# Patient Record
Sex: Male | Born: 1995 | Race: White | Hispanic: No | Marital: Single | Smoking: Never smoker
Health system: Southern US, Community
[De-identification: ages and names within clinical notes are randomized; demographics above are authoritative.]

## PROBLEM LIST (undated history)

## (undated) DIAGNOSIS — J45909 Unspecified asthma, uncomplicated: Secondary | ICD-10-CM

---

## 2014-12-08 ENCOUNTER — Encounter (HOSPITAL_BASED_OUTPATIENT_CLINIC_OR_DEPARTMENT_OTHER): Payer: Self-pay | Admitting: *Deleted

## 2014-12-08 ENCOUNTER — Emergency Department (HOSPITAL_BASED_OUTPATIENT_CLINIC_OR_DEPARTMENT_OTHER): Payer: BLUE CROSS/BLUE SHIELD

## 2014-12-08 ENCOUNTER — Emergency Department (HOSPITAL_BASED_OUTPATIENT_CLINIC_OR_DEPARTMENT_OTHER)
Admission: EM | Admit: 2014-12-08 | Discharge: 2014-12-08 | Disposition: A | Discharge status: Home / Self Care | Payer: BLUE CROSS/BLUE SHIELD | Attending: Emergency Medicine | Admitting: Emergency Medicine

## 2014-12-08 DIAGNOSIS — S99921A Unspecified injury of right foot, initial encounter: Secondary | ICD-10-CM | POA: Diagnosis present

## 2014-12-08 DIAGNOSIS — Y9389 Activity, other specified: Secondary | ICD-10-CM | POA: Insufficient documentation

## 2014-12-08 DIAGNOSIS — Y9289 Other specified places as the place of occurrence of the external cause: Secondary | ICD-10-CM | POA: Insufficient documentation

## 2014-12-08 DIAGNOSIS — S92421A Displaced fracture of distal phalanx of right great toe, initial encounter for closed fracture: Secondary | ICD-10-CM | POA: Insufficient documentation

## 2014-12-08 DIAGNOSIS — S6981XA Other specified injuries of right wrist, hand and finger(s), initial encounter: Secondary | ICD-10-CM

## 2014-12-08 DIAGNOSIS — W208XXA Other cause of strike by thrown, projected or falling object, initial encounter: Secondary | ICD-10-CM | POA: Insufficient documentation

## 2014-12-08 DIAGNOSIS — S92911B Unspecified fracture of right toe(s), initial encounter for open fracture: Secondary | ICD-10-CM

## 2014-12-08 DIAGNOSIS — Y990 Civilian activity done for income or pay: Secondary | ICD-10-CM | POA: Insufficient documentation

## 2014-12-08 MED ORDER — CEPHALEXIN 500 MG PO CAPS: 500.0000 mg | ORAL_CAPSULE | Freq: Four times a day (QID) | ORAL | Status: DC

## 2014-12-08 MED ORDER — LIDOCAINE HCL 2 % IJ SOLN: 10.0000 mL | Freq: Once | INTRAMUSCULAR | Status: DC

## 2014-12-08 MED ORDER — LIDOCAINE HCL 2 % IJ SOLN
INTRAMUSCULAR | Status: AC
  Filled 2014-12-08: qty 20

## 2014-12-08 NOTE — ED Notes (Signed)
Pt to triage in w/c, reports dropping a weight on his right great toe while working out 2 days ago.

## 2014-12-08 NOTE — Discharge Instructions (Signed)
Cast or Splint Care Casts and splints support injured limbs and keep bones from moving while they heal.  HOME CARE  Keep the cast or splint uncovered during the drying period.  A plaster cast can take 24 to 48 hours to dry.  A fiberglass cast will dry in less than 1 hour.  Do not rest the cast on anything harder than a pillow for 24 hours.  Do not put weight on your injured limb. Do not put pressure on the cast. Wait for your doctor's approval.  Keep the cast or splint dry.  Cover the cast or splint with a plastic bag during baths or wet weather.  If you have a cast over your chest and belly (trunk), take sponge baths until the cast is taken off.  If your cast gets wet, dry it with a towel or blow dryer. Use the cool setting on the blow dryer.  Keep your cast or splint clean. Wash a dirty cast with a damp cloth.  Do not put any objects under your cast or splint.  Do not scratch the skin under the cast with an object. If itching is a problem, use a blow dryer on a cool setting over the itchy area.  Do not trim or cut your cast.  Do not take out the padding from inside your cast.  Exercise your joints near the cast as told by your doctor.  Raise (elevate) your injured limb on 1 or 2 pillows for the first 1 to 3 days. GET HELP IF:  Your cast or splint cracks.  Your cast or splint is too tight or too loose.  You itch badly under the cast.  Your cast gets wet or has a soft spot.  You have a bad smell coming from the cast.  You get an object stuck under the cast.  Your skin around the cast becomes red or sore.  You have new or more pain after the cast is put on. GET HELP RIGHT AWAY IF:  You have fluid leaking through the cast.  You cannot move your fingers or toes.  Your fingers or toes turn blue or white or are cool, painful, or puffy (swollen).  You have tingling or lose feeling (numbness) around the injured area.  You have bad pain or pressure under the  cast.  You have trouble breathing or have shortness of breath.  You have chest pain. Document Released: 10/24/2010 Document Revised: 02/24/2013 Document Reviewed: 12/31/2012 [facility] Patient Information 2015 [place], [place]. This information is not intended to replace advice given to you by your health care provider. Make sure you discuss any questions you have with your health care provider.  Nail Avulsion Injury Nail avulsion means that you have lost the whole, or part of a nail. The nail will usually grow back in 2 to 6 months. If your injury damaged the growth center of the nail, the nail may be deformed, split, or not stuck to the nail bed. Sometimes the avulsed nail is stitched back in place. This provides temporary protection to the nail bed until the new nail grows in.  HOME CARE INSTRUCTIONS   Raise (elevate) your injury as much as possible.  Protect the injury and cover it with bandages (dressings) or splints as instructed.  Change dressings as instructed. SEEK MEDICAL CARE IF:   There is increasing pain, redness, or swelling.  You cannot move your fingers or toes. Document Released: 08/01/2004 Document Revised: 09/16/2011 Document Reviewed: 05/26/2009 ExitCare Patient Information 2015  [employer]. This information is not intended to replace advice given to you by your health care provider. Make sure you discuss any questions you have with your health care provider.

## 2014-12-08 NOTE — ED Provider Notes (Signed)
CSN: [id number]     Arrival date & time 12/08/14  1619 History   First MD Initiated Contact with Patient 12/08/14 1635     Chief Complaint  Patient presents with  . Foot Pain     (Consider location/radiation/quality/duration/timing/severity/associated sxs/prior Treatment) HPI Comments: He dropped a wt to his toe and he was trying to take care of it himself and it doesn't seem to be getting any better  Patient is a 19 y.o. male presenting with lower extremity pain. The history is provided by the patient. No language interpreter was used.  Foot Pain This is a new problem. The current episode started in the past 7 days. The problem occurs constantly. The problem has been unchanged. Pertinent negatives include no numbness. The symptoms are aggravated by walking.    History reviewed. No pertinent past medical history. History reviewed. No pertinent past surgical history. No family history on file. History  Substance Use Topics  . Smoking status: Never Smoker   . Smokeless tobacco: Not on file  . Alcohol Use: Not on file    Review of Systems  Neurological: Negative for numbness.  All other systems reviewed and are negative.     Allergies  Review of patient's allergies indicates no known allergies.  Home Medications   Prior to Admission medications   Not on File   There were no vitals taken for this visit. Physical Exam  Constitutional: He is oriented to person, place, and time. He appears well-developed and well-nourished.  Cardiovascular: Normal rate and regular rhythm.   Pulmonary/Chest: Effort normal and breath sounds normal.  Musculoskeletal:  Obvious swelling and tenderness noted to the right great toe  Neurological: He is alert and oriented to person, place, and time.  Skin: Skin is warm and dry.  Partial nail avulsion on the right great toe  Nursing note and vitals reviewed.   ED Course  NAIL REMOVAL Date/Time: 12/08/2014 6:05 PM Performed by: Teressa LowerPICKERING,  Brogen Duell Authorized by: Teressa LowerPICKERING, Keasia Dubose Consent: Verbal consent obtained. Consent given by: patient Patient identity confirmed: verbally with patient Anesthesia: digital block Local anesthetic: lidocaine 2% without epinephrine Preparation: skin prepped with Betadine Amount removed: complete Wedge excision of skin of nail fold: yes Nail bed sutured: no Removed nail replaced and anchored: yes Dressing: dressing applied and petrolatum-impregnated gauze Patient tolerance: Patient tolerated the procedure well with no immediate complications   (including critical care time) Labs Review Labs Reviewed - No data to display  Imaging Review Dg Toe Great Right  12/08/2014   CLINICAL DATA:  Patient dropped weight on right great toe 2 days ago  EXAM: RIGHT GREAT TOE  COMPARISON:  None.  FINDINGS: There is a fracture deformity involving the stress that nondisplaced fracture involves the tuft of the distal phalanx. There is overlying diffuse soft tissue swelling. No radiopaque foreign bodies identified.  IMPRESSION: 1. Acute fracture involves the tuft of the distal phalanx.   Electronically Signed   By: Signa Kellaylor  Stroud M.D.   On: 12/08/2014 17:32     EKG Interpretation None      MDM   Final diagnoses:  Open toe fracture, right, initial encounter  Nailbed injury, right, initial encounter    Pt started on keflex as open toe fracture. Toe nail removed    Teressa LowerVrinda Ayde Record, NP 12/08/14 1806  Vanetta MuldersScott Zackowski, MD 12/08/14 2317

## 2015-07-15 ENCOUNTER — Emergency Department (HOSPITAL_COMMUNITY)
Admission: EM | Admit: 2015-07-15 | Discharge: 2015-07-15 | Disposition: A | Discharge status: Home / Self Care | Payer: 59 | Attending: Emergency Medicine | Admitting: Emergency Medicine

## 2015-07-15 ENCOUNTER — Encounter (HOSPITAL_COMMUNITY): Payer: Self-pay | Admitting: *Deleted

## 2015-07-15 ENCOUNTER — Emergency Department (HOSPITAL_COMMUNITY): Payer: 59

## 2015-07-15 DIAGNOSIS — Y9389 Activity, other specified: Secondary | ICD-10-CM | POA: Diagnosis not present

## 2015-07-15 DIAGNOSIS — S4992XA Unspecified injury of left shoulder and upper arm, initial encounter: Secondary | ICD-10-CM | POA: Insufficient documentation

## 2015-07-15 DIAGNOSIS — Y998 Other external cause status: Secondary | ICD-10-CM | POA: Diagnosis not present

## 2015-07-15 DIAGNOSIS — M25512 Pain in left shoulder: Secondary | ICD-10-CM

## 2015-07-15 DIAGNOSIS — Z792 Long term (current) use of antibiotics: Secondary | ICD-10-CM | POA: Insufficient documentation

## 2015-07-15 DIAGNOSIS — Y9289 Other specified places as the place of occurrence of the external cause: Secondary | ICD-10-CM | POA: Diagnosis not present

## 2015-07-15 MED ORDER — IBUPROFEN 800 MG PO TABS
800.0000 mg | ORAL_TABLET | Freq: Once | ORAL | Status: AC
  Administered 2015-07-15: 800 mg via ORAL
  Filled 2015-07-15: qty 1

## 2015-07-15 MED ORDER — OXYCODONE HCL 5 MG PO TABS
5.0000 mg | ORAL_TABLET | Freq: Once | ORAL | Status: AC
  Administered 2015-07-15: 5 mg via ORAL
  Filled 2015-07-15: qty 1

## 2015-07-15 MED ORDER — ACETAMINOPHEN 500 MG PO TABS
1000.0000 mg | ORAL_TABLET | Freq: Once | ORAL | Status: AC
  Administered 2015-07-15: 1000 mg via ORAL
  Filled 2015-07-15: qty 2

## 2015-07-15 NOTE — ED Notes (Signed)
The pt fell snow boarding just pta.  He is c/o lt shoulder pain.  No  Previous injury

## 2015-07-15 NOTE — ED Notes (Signed)
The pts pain is less now than earlier.  More pain with movement.  Goof radial pulse on the lt

## 2015-07-15 NOTE — Discharge Instructions (Signed)
Take 4 over the counter ibuprofen tablets 3 times a day or 2 over-the-counter naproxen tablets twice a day for pain. ° °Joint Pain °Joint pain, which is also called arthralgia, can be caused by many things. Joint pain often goes away when you follow your health care provider's instructions for relieving pain at home. However, joint pain can also be caused by conditions that require further treatment. Common causes of joint pain include: °· Bruising in the area of the joint. °· Overuse of the joint. °· Wear and tear on the joints that occur with aging (osteoarthritis). °· Various other forms of arthritis. °· A buildup of a crystal form of uric acid in the joint (gout). °· Infections of the joint (septic arthritis) or of the bone (osteomyelitis). °Your health care provider may recommend medicine to help with the pain. If your joint pain continues, additional tests may be needed to diagnose your condition. °HOME CARE INSTRUCTIONS °Watch your condition for any changes. Follow these instructions as directed to lessen the pain that you are feeling. °· Take medicines only as directed by your health care provider. °· Rest the affected area for as long as your health care provider says that you should. If directed to do so, raise the painful joint above the level of your heart while you are sitting or lying down. °· Do not do things that cause or worsen pain. °· If directed, apply ice to the painful area: °¨ Put ice in a plastic bag. °¨ Place a towel between your skin and the bag. °¨ Leave the ice on for 20 minutes, 2-3 times per day. °· Wear an elastic bandage, splint, or sling as directed by your health care provider. Loosen the elastic bandage or splint if your fingers or toes become numb and tingle, or if they turn cold and blue. °· Begin exercising or stretching the affected area as directed by your health care provider. Ask your health care provider what types of exercise are safe for you. °· Keep all follow-up visits  as directed by your health care provider. This is important. °SEEK MEDICAL CARE IF: °· Your pain increases, and medicine does not help. °· Your joint pain does not improve within 3 days. °· You have increased bruising or swelling. °· You have a fever. °· You lose 10 lb (4.5 kg) or more without trying. °SEEK IMMEDIATE MEDICAL CARE IF: °· You are not able to move the joint. °· Your fingers or toes become numb or they turn cold and blue. °  °This information is not intended to replace advice given to you by your health care provider. Make sure you discuss any questions you have with your health care provider. °  °Document Released: 06/24/2005 Document Revised: 07/15/2014 Document Reviewed: 04/05/2014 °Elsevier Interactive Patient Education ©2016 [employer] ° °

## 2015-07-15 NOTE — ED Provider Notes (Signed)
CSN: [id number]     Arrival date & time 07/15/15  1629 History   First MD Initiated Contact with Patient 07/15/15 1634     Chief Complaint  Patient presents with  . Shoulder Injury     (Consider location/radiation/quality/duration/timing/severity/associated sxs/prior Treatment) Patient is a 20 y.o. male presenting with shoulder injury. The history is provided by the patient.  Shoulder Injury This is a new problem. The current episode started 1 to 2 hours ago. The problem occurs constantly. The problem has not changed since onset.Pertinent negatives include no chest pain, no abdominal pain, no headaches and no shortness of breath. Nothing aggravates the symptoms. Nothing relieves the symptoms. He has tried nothing for the symptoms. The treatment provided no relief.    20 yo M with a chief complaint of a left shoulder injury. Patient was snowboarding and fell onto his anterior aspect of his left shoulder. Had significant pain and tenderness to the area then moved a certain way and felt a pop. Since then patient has been feeling much better. Denies head injury loss of consciousness neck pain back pain chest pain abdominal pain.  History reviewed. No pertinent past medical history. History reviewed. No pertinent past surgical history. No family history on file. Social History  Substance Use Topics  . Smoking status: Never Smoker   . Smokeless tobacco: None  . Alcohol Use: Yes    Review of Systems  Constitutional: Negative for fever and chills.  HENT: Negative for congestion and facial swelling.   Eyes: Negative for discharge and visual disturbance.  Respiratory: Negative for shortness of breath.   Cardiovascular: Negative for chest pain and palpitations.  Gastrointestinal: Negative for vomiting, abdominal pain and diarrhea.  Musculoskeletal: Positive for myalgias and arthralgias.  Skin: Negative for color change and rash.  Neurological: Negative for tremors, syncope and headaches.   Psychiatric/Behavioral: Negative for confusion and dysphoric mood.      Allergies  Shellfish allergy  Home Medications   Prior to Admission medications   Medication Sig Start Date End Date Taking? Authorizing Provider  cephALEXin (KEFLEX) 500 MG capsule Take 1 capsule (500 mg total) by mouth 4 (four) times daily. 12/08/14   Teressa LowerVrinda Pickering, NP   BP 143/77 mmHg  Pulse 105  Temp(Src) 98.2 F (36.8 C) (Oral)  Resp 16  SpO2 96% Physical Exam  Constitutional: He is oriented to person, place, and time. He appears well-developed and well-nourished.  HENT:  Head: Normocephalic and atraumatic.  Eyes: EOM are normal. Pupils are equal, round, and reactive to light.  Neck: Normal range of motion. Neck supple. No JVD present.  Cardiovascular: Normal rate and regular rhythm.  Exam reveals no gallop and no friction rub.   No murmur heard. Pulmonary/Chest: No respiratory distress. He has no wheezes.  Abdominal: He exhibits no distension. There is no tenderness. There is no rebound and no guarding.  Musculoskeletal: Normal range of motion. He exhibits tenderness. He exhibits no edema.  PMS intact distally, sits muscles intact. No noted signs of trauma to the head no midline C-spine tenderness.  Neurological: He is alert and oriented to person, place, and time.  Skin: No rash noted. No pallor.  Psychiatric: He has a normal mood and affect. His behavior is normal.  Nursing note and vitals reviewed.   ED Course  Procedures (including critical care time) Labs Review Labs Reviewed - No data to display  Imaging Review Dg Shoulder Left  07/15/2015  CLINICAL DATA:  Fall.  Snow boarding accident. EXAM: LEFT SHOULDER -  2+ VIEW COMPARISON:  None. FINDINGS: There is no evidence of fracture or dislocation. There is no evidence of arthropathy or other focal bone abnormality. Soft tissues are unremarkable. IMPRESSION: Negative. Electronically Signed   By: Signa Kell M.D.   On: 07/15/2015 17:14   I  have personally reviewed and evaluated these images and lab results as part of my medical decision-making.   EKG Interpretation None      MDM   Final diagnoses:  Shoulder pain, acute, left    20 yo M with a chief complaint of a left shoulder injury. Grossly normal exam. X-ray appears normal as read by me. Awaiting radiology read.  X-rays negative. Patient is discharged home. NSAIDs for pain. Sling for comfort.  6:14 PM:  I have discussed the diagnosis/risks/treatment options with the patient and family and believe the pt to be eligible for discharge home to follow-up with PCP. We also discussed returning to the ED immediately if new or worsening sx occur. We discussed the sx which are most concerning (e.g., sudden worsening pain, numbness) that necessitate immediate return. Medications administered to the patient during their visit and any new prescriptions provided to the patient are listed below.  Medications given during this visit Medications  acetaminophen (TYLENOL) tablet 1,000 mg (1,000 mg Oral Given 07/15/15 1715)  ibuprofen (ADVIL,MOTRIN) tablet 800 mg (800 mg Oral Given 07/15/15 1715)  oxyCODONE (Oxy IR/ROXICODONE) immediate release tablet 5 mg (5 mg Oral Given 07/15/15 1715)    New Prescriptions   No medications on file    The patient appears reasonably screen and/or stabilized for discharge and I doubt any other medical condition or other [facility] requiring further screening, evaluation, or treatment in the ED at this time prior to discharge.    Melene Plan, DO 07/15/15 1814

## 2015-07-15 NOTE — ED Notes (Signed)
Pt home stable, ambulatory with brother. After verbalizes understanding of instructions.

## 2016-03-04 ENCOUNTER — Encounter (HOSPITAL_COMMUNITY): Payer: Self-pay

## 2016-03-04 ENCOUNTER — Emergency Department (HOSPITAL_COMMUNITY)
Admission: EM | Admit: 2016-03-04 | Discharge: 2016-03-04 | Disposition: A | Discharge status: Home / Self Care | Payer: 59 | Attending: Emergency Medicine | Admitting: Emergency Medicine

## 2016-03-04 ENCOUNTER — Emergency Department (HOSPITAL_COMMUNITY): Payer: Self-pay

## 2016-03-04 DIAGNOSIS — D72829 Elevated white blood cell count, unspecified: Secondary | ICD-10-CM

## 2016-03-04 DIAGNOSIS — R079 Chest pain, unspecified: Secondary | ICD-10-CM | POA: Diagnosis not present

## 2016-03-04 DIAGNOSIS — Z79899 Other long term (current) drug therapy: Secondary | ICD-10-CM | POA: Insufficient documentation

## 2016-03-04 DIAGNOSIS — E876 Hypokalemia: Secondary | ICD-10-CM

## 2016-03-04 DIAGNOSIS — R0789 Other chest pain: Secondary | ICD-10-CM | POA: Insufficient documentation

## 2016-03-04 HISTORY — DX: Unspecified asthma, uncomplicated: J45.909

## 2016-03-04 LAB — I-STAT TROPONIN, ED: Troponin i, poc: 0 ng/mL (ref 0.00–0.08)

## 2016-03-04 LAB — CBC
HEMATOCRIT: 42.9 % (ref 39.0–52.0)
HEMOGLOBIN: 13.9 g/dL (ref 13.0–17.0)
MCH: 20.1 pg — ABNORMAL LOW (ref 26.0–34.0)
MCHC: 32.4 g/dL (ref 30.0–36.0)
MCV: 62.2 fL — AB (ref 78.0–100.0)
Platelets: 279 10*3/uL (ref 150–400)
RBC: 6.9 MIL/uL — ABNORMAL HIGH (ref 4.22–5.81)
RDW: 15.1 % (ref 11.5–15.5)
WBC: 13.2 10*3/uL — AB (ref 4.0–10.5)

## 2016-03-04 LAB — RAPID URINE DRUG SCREEN, HOSP PERFORMED
Amphetamines: NOT DETECTED
BARBITURATES: NOT DETECTED
Benzodiazepines: NOT DETECTED
Cocaine: NOT DETECTED
OPIATES: NOT DETECTED
TETRAHYDROCANNABINOL: POSITIVE — AB

## 2016-03-04 LAB — BASIC METABOLIC PANEL
ANION GAP: 8 (ref 5–15)
BUN: 7 mg/dL (ref 6–20)
CHLORIDE: 108 mmol/L (ref 101–111)
CO2: 23 mmol/L (ref 22–32)
Calcium: 10 mg/dL (ref 8.9–10.3)
Creatinine, Ser: 0.94 mg/dL (ref 0.61–1.24)
GFR calc Af Amer: >60 mL/min (ref >60)
GLUCOSE: 105 mg/dL — AB (ref 65–99)
POTASSIUM: 3.2 mmol/L — AB (ref 3.5–5.1)
SODIUM: 139 mmol/L (ref 135–145)

## 2016-03-04 LAB — D-DIMER, QUANTITATIVE (NOT AT ARMC): D-Dimer, Quant: <0.27 ug/mL-FEU (ref 0.00–0.50)

## 2016-03-04 MED ORDER — ASPIRIN 81 MG PO CHEW
324.0000 mg | CHEWABLE_TABLET | Freq: Once | ORAL | Status: AC
  Administered 2016-03-04: 324 mg via ORAL
  Filled 2016-03-04: qty 4

## 2016-03-04 MED ORDER — POTASSIUM CHLORIDE CRYS ER 20 MEQ PO TBCR
40.0000 meq | EXTENDED_RELEASE_TABLET | Freq: Once | ORAL | Status: AC
Start: 2016-03-04 — End: 2016-03-04
  Administered 2016-03-04: 40 meq via ORAL
  Filled 2016-03-04: qty 2

## 2016-03-04 MED ORDER — IOPAMIDOL (ISOVUE-370) INJECTION 76%
INTRAVENOUS | Status: AC
  Filled 2016-03-04: qty 100

## 2016-03-04 MED ORDER — IOPAMIDOL (ISOVUE-370) INJECTION 76%
100.0000 mL | Freq: Once | INTRAVENOUS | Status: AC | PRN
  Administered 2016-03-04: 100 mL via INTRAVENOUS

## 2016-03-04 MED ORDER — ALBUTEROL SULFATE HFA 108 (90 BASE) MCG/ACT IN AERS
1.0000 | INHALATION_SPRAY | RESPIRATORY_TRACT | Status: DC | PRN
  Administered 2016-03-04: 1 via RESPIRATORY_TRACT
  Filled 2016-03-04: qty 6.7

## 2016-03-04 NOTE — ED Triage Notes (Signed)
Pt presents for evaluation of 2 episodes of CP and SOB starting yesterday. Pt. States he feels this could be related to anxiety, states has had high stress recently from school. Pt. States yesterday he had episode of central chest pressure with pain to L arm and SOB. Pt. States felt a similar episode this AM. Pt. AxO x4, no SOB at this time. Chest pressure 4/10.

## 2016-03-04 NOTE — Consult Note (Signed)
Cardiology History & Physical    Patient ID: Thomas Solomon MRN: [id number], DOB: 1995/07/22 Date of Encounter: 03/04/2016, 12:58 PM Primary Physician: No PCP Per Patient Primary Cardiologist: new to Dr. Antoine Poche  Chief Complaint: chest pain/SOB Reason for Admission: above Requesting MD: Dr. Anitra Lauth  HPI: Thomas Solomon is a 20 y/o M with history of asthma and marijuana use who presented to [facility] today with chest pain. He has no personal or family history of heart disease. Yesterday while pumping gas, he noticed a focal pinching sensation in his left shoulder. He went home as usual where he developed sudden onset of SOB, worse with ambulation, associated with left arm numbness which then evolved into right arm numbness. He fell to his knees trying to stay still to get it to stop. It resolved after 20 seconds. He felt weak afterwards. He noticed on/off he had issues sleeping but not for any particular symptom reason. This morning he noticed he felt nauseated lying down. He went to walk his dog and anytime he exerted himself he noticed chest pressure and SOB which improved with rest. He also has noticed pain near his left axilla whenever he takes a deep breath. No syncope, palpitations, LEE, orthopnea, recent travel/surgery/bedrest. The only other thing that made sx worse was thinking about it/worrying about it. He currently denies any sx at rest. HR 95-120 in the ER, pulse ox wnl, WBC 13k, K 3.2, trop neg, d-dimer neg. CT angio/aorta showed no evidence of central PE and no acute dissection, d-dimer negative. EKG with nonspecific changes. He denies drug use other than THC - states he has not used around the time of this event. No recent URI.  Past Medical History:  Diagnosis Date  . Childhood asthma      Surgical History: No previous surgical hx.  Home Meds: Prior to Admission medications   Not on File    Allergies:  Allergies  Allergen Reactions  . Shellfish Allergy Swelling    Lip swelling       Social History   Social History  . Marital status: Single    Spouse name: N/A  . Number of children: N/A  . Years of education: N/A   Occupational History  . Not on file.   Social History Main Topics  . Smoking status: Never Smoker  . Smokeless tobacco: Never Used  . Alcohol use Yes     Comment: rare, 1x/month  . Drug use:     Types: Marijuana  . Sexual activity: Not on file   Other Topics Concern  . Not on file   Social History Narrative  . No narrative on file     Family History  Problem Relation Age of Onset  . Diabetes Other     Review of Systems: no bleeding reported All other systems reviewed and are otherwise negative except as noted above.  Labs:   Lab Results  Component Value Date   WBC 13.2 (H) 03/04/2016   HGB 13.9 03/04/2016   HCT 42.9 03/04/2016   MCV 62.2 (L) 03/04/2016   PLT 279 03/04/2016    Recent Labs Lab 03/04/16 0840  NA 139  K 3.2*  CL 108  CO2 23  BUN 7  CREATININE 0.94  CALCIUM 10.0  GLUCOSE 105*   No results for input(s): CKTOTAL, CKMB, TROPONINI in the last 72 hours. No results found for: CHOL, HDL, LDLCALC, TRIG Lab Results  Component Value Date   DDIMER <0.27 03/04/2016    Radiology/Studies:  Dg  Chest 2 View  Result Date: 03/04/2016 CLINICAL DATA:  Chest pain. EXAM: CHEST  2 VIEW COMPARISON:  No prior. FINDINGS: Mediastinum and hilar structures normal. Heart size normal. Mild right middle lobe subsegmental atelectasis, best seen on lateral view. No pleural effusion or pneumothorax. No acute bony abnormality. IMPRESSION: Mild right middle lobe subsegmental atelectasis, best seen on lateral view . Otherwise no significant abnormality identified. Electronically Signed   By: Maisie Fushomas  Register   On: 03/04/2016 09:09   Ct Angio Chest/abd/pel For Dissection W And/or Wo Contrast  Result Date: 03/04/2016 CLINICAL DATA:  Chest pain and shortness of breath starting yesterday. Central chest pressure radiating to the left arm.  EXAM: CT ANGIOGRAPHY CHEST, ABDOMEN AND PELVIS TECHNIQUE: Multidetector CT imaging through the chest, abdomen and pelvis was performed using the standard protocol during bolus administration of intravenous contrast. Multiplanar reconstructed images and MIPs were obtained and reviewed to evaluate the vascular anatomy. CONTRAST:  100 cc Isovue 370 COMPARISON:  Chest radiograph 03/04/2016 FINDINGS: CTA CHEST FINDINGS Cardiovascular: No significant atherosclerotic calcifications. No hyperdensity along the aortic wall to suggest intramural hematoma. No findings of thoracic aortic or branch vessel dissection. There is adequate pulmonary arterial contrast to assess the central pulmonary arteries, which appear patent. 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 Review of the MIP images confirms the above findings. CTA ABDOMEN AND PELVIS FINDINGS Hepatobiliary: Normal arterial phase appearance. Pancreas: Unremarkable Spleen: Unremarkable Adrenals/Urinary Tract: 4 by 5 mm hypodense lesion in the left mid to lower kidney anteriorly, image 183/7, favoring a tiny cyst but technically nonspecific. Adrenal glands normal. Stomach/Bowel: Unremarkable Vascular/Lymphatic: Celiac trunk: Widely patent, conventional hepatic arterial branching. SMA: Widely patent IMA: Widely patent Renal arteries:  Single right and 2 left renal arteries are patent. Abdominal aorta: Normal caliber common no dissection or significant abnormality Pelvic arterial structures: Normal Venous structures: Portal vein patent, no obvious venous abnormality. Lymphatic:  No pathologic adenopathy identified. Reproductive: Unremarkable Other: No supplemental non-categorized findings. Musculoskeletal: Unremarkable Review of the MIP images confirms the above findings. IMPRESSION: 1. No dissection or significant vascular abnormality detected. 2. A cause for the patient's chest pain and  shortness of breath is not observed. 3. 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 Electronically Signed   By: Gaylyn RongWalter  Liebkemann M.D.   On: 03/04/2016 10:54   Wt Readings from Last 3 Encounters:  03/04/16 151 lb (68.5 kg)    EKG:  1) sinus tach, 115bpm, possible RVH, inferior Q waves with TWI, nonspecific TW changes V5-V6 2) f/u ekg similar with slower rate  Physical Exam: Blood pressure 127/65, pulse 72, temperature 98.5 F (36.9 C), resp. rate 17, height 5\' 10"  (1.778 m), weight 151 lb (68.5 kg), SpO2 99 %. Body mass index is 21.67 kg/m. General: Well developed, well nourished, in no acute distress. Head: Normocephalic, atraumatic, sclera non-icteric, no xanthomas, nares are without discharge.  Neck: Negative for carotid bruits. JVD not elevated. Lungs: Clear bilaterally to auscultation without wheezes, rales, or rhonchi. Breathing is unlabored. Heart: RRR with S1 S2. No murmurs, rubs, or gallops appreciated. HR increases when patient sits up for auscultation Abdomen: Soft, non-tender, non-distended with normoactive bowel sounds. No hepatomegaly. No rebound/guarding. No obvious abdominal masses. Msk:  Strength and tone appear normal for age. Extremities: No clubbing or cyanosis. No edema.  Distal pedal pulses are 2+ and equal bilaterally. Neuro: Alert and oriented X 3. No focal deficit. No facial asymmetry. Moves all extremities spontaneously. Psych:  Responds to questions  appropriately with a normal affect.  Pt wanted family in the room during interview - he declined offer to speak in private.  Assessment and Plan  20 y/o M with history of asthma and marijuana use presented to [facility] with intermittent chest pain/dyspnea since yesterday, noted to have abnormal EKG, hypokalemia, and leukocytosis.  1. Chest pain - mixed atypical/typical features. Abnormal EKG noted but may be normal variant for thin/young age. Patient has poverty of risk factors for obstructive  CAD - young age, no family history, no regular tobacco abuse or other medical problems. CTA/d-dimer has ruled out PE and dissection. There is a component of pleuritic discomfort upon inspiration, but otherwise cardiac sx have improved. He also reports associated anxiety recently. Will review further evaluation with MD.  2. Hypokalemia - repleted in ED.   3. Marijuana use - UDS neg for other agents.  Signed, Laurann Montana PA-C 03/04/2016, 12:58 PM Pager: [phone]  History and all data above reviewed.  Patient examined.  I agree with the findings as above.  The patient has pain that is atypical.  No objective evidence of ischemia or pericarditis.  Perhaps he has some pleuritis or other non cardiac inflammation.   EKG is nonspecific.  He has no acute ST T wave changes.   The patient exam reveals COR:RRR, no rub ,  Lungs: Clear  ,  Abd: Positive bowel sounds, no rebound no guarding, Ext No edema  .  All available labs, radiology testing, previous records reviewed. Agree with documented assessment and plan. Chest pain:  Atypical.  I do not suggest any other cardiac work up at this point.    Fayrene Fearing Tiaunna Buford  2:41 PM  03/04/2016

## 2016-03-04 NOTE — ED Provider Notes (Signed)
[facility]-EMERGENCY DEPT Provider Note   CSN: [id number] Arrival date & time: 03/04/16  0810     History   Chief Complaint Chief Complaint  Patient presents with  . Chest Pain    HPI Thomas Solomon is a 20 y.o. male.  Patient is a healthy 20 year old male presenting today with sudden onset of chest pain while he was pumping gas yesterday. He describes it as a sharp pain in the left side of his chest which worsened when he tried to take a walk to the point where it caused him to have near syncope and he fell to the ground where it radiated down his left and right arms. The sharp pain eventually subsided to a dull pressure but he noticed last night that the pain was going into his upper back and made him feel nauseated. He has the severe pain he feels short of breath. He was able to sleep last night but woke up several times with discomfort. This morning after walking just August the pain became severe again he had severe shortness of breath and a second episode of near syncope. Patient does admit to smoking marijuana but did not smoke marijuana when symptoms started and has not smoked marijuana today. He denies alcohol use or tobacco use. He has no known medical problems and takes no medications. No significant family history.   The history is provided by the patient.  Chest Pain   This is a new problem. The current episode started yesterday. The problem occurs constantly. Progression since onset: waxing and waning. The pain is associated with exertion (started at rest but worse with exertion). The pain is present in the substernal region and lateral region. The pain is at a severity of 4/10. Pain severity now: severe at the worst but currently mild. The quality of the pain is described as sharp and pressure-like. The pain radiates to the right arm, left arm and upper back. Duration of episode(s) is 12 hours. The symptoms are aggravated by exertion. Associated symptoms include back pain, exertional  chest pressure, nausea, near-syncope and shortness of breath. Pertinent negatives include no abdominal pain, no cough, no diaphoresis and no lower extremity edema. He has tried rest for the symptoms. The treatment provided mild relief. There are no known risk factors.  Pertinent negatives for family medical history include: no CAD and no connective tissue disease.    History reviewed. No pertinent past medical history.  There are no active problems to display for this patient.   History reviewed. No pertinent surgical history.     Home Medications    Prior to Admission medications   Medication Sig Start Date End Date Taking? Authorizing Provider  cephALEXin (KEFLEX) 500 MG capsule Take 1 capsule (500 mg total) by mouth 4 (four) times daily. 12/08/14   Teressa Lower, NP    Family History No family history on file.  Social History Social History  Substance Use Topics  . Smoking status: Never Smoker  . Smokeless tobacco: Never Used  . Alcohol use Yes     Allergies   Shellfish allergy   Review of Systems Review of Systems  Constitutional: Negative for diaphoresis.  Respiratory: Positive for shortness of breath. Negative for cough.   Cardiovascular: Positive for chest pain and near-syncope.  Gastrointestinal: Positive for nausea. Negative for abdominal pain.  Musculoskeletal: Positive for back pain.  All other systems reviewed and are negative.    Physical Exam Updated Vital Signs BP 148/74 (BP Location: Right Arm)   Pulse  106   Temp 97.9 F (36.6 C) (Oral)   Resp 16   Ht 5\' 10"  (1.778 m)   Wt 151 lb (68.5 kg)   SpO2 100%   BMI 21.67 kg/m   Physical Exam  Constitutional: He is oriented to person, place, and time. He appears well-developed and well-nourished. No distress.  HENT:  Head: Normocephalic and atraumatic.  Mouth/Throat: Oropharynx is clear and moist.  Eyes: Conjunctivae and EOM are normal. Pupils are equal, round, and reactive to light.  Neck:  Normal range of motion. Neck supple.  Cardiovascular: Regular rhythm and intact distal pulses.  Tachycardia present.   No murmur heard. Pulses feel equal in both upper and lower extremities  Pulmonary/Chest: Effort normal and breath sounds normal. No respiratory distress. He has no wheezes. He has no rales.  Abdominal: Soft. He exhibits no distension. There is no tenderness. There is no rebound and no guarding.  Musculoskeletal: Normal range of motion. He exhibits no edema or tenderness.  No calf tenderness or swelling  Neurological: He is alert and oriented to person, place, and time.  Skin: Skin is warm and dry. No rash noted. No erythema.  Psychiatric: He has a normal mood and affect. His behavior is normal.  Nursing note and vitals reviewed.    ED Treatments / Results  Labs (all labs ordered are listed, but only abnormal results are displayed) Labs Reviewed  BASIC METABOLIC PANEL - Abnormal; Notable for the following:       Result Value   Potassium 3.2 (*)    Glucose, Bld 105 (*)    All other components within normal limits  CBC - Abnormal; Notable for the following:    WBC 13.2 (*)    RBC 6.90 (*)    MCV 62.2 (*)    MCH 20.1 (*)    All other components within normal limits  D-DIMER, QUANTITATIVE (NOT AT [facility])  I-STAT TROPOININ, ED    EKG  EKG Interpretation  Date/Time:  Monday March 04 2016 08:18:42 EDT Ventricular Rate:  115 PR Interval:    QRS Duration: 102 QT Interval:  316 QTC Calculation: 437 R Axis:   104 Text Interpretation:  Sinus tachycardia Consider right ventricular hypertrophy Inferior infarct, age indeterminate Anterolateral infarct, age indeterminate No previous tracing Confirmed by Anitra LauthPLUNKETT  MD, Juquan Reznick ([id number]) on 03/04/2016 8:25:01 AM       Radiology Dg Chest 2 View  Result Date: 03/04/2016 CLINICAL DATA:  Chest pain. EXAM: CHEST  2 VIEW COMPARISON:  No prior. FINDINGS: Mediastinum and hilar structures normal. Heart size normal. Mild right  middle lobe subsegmental atelectasis, best seen on lateral view. No pleural effusion or pneumothorax. No acute bony abnormality. IMPRESSION: Mild right middle lobe subsegmental atelectasis, best seen on lateral view . Otherwise no significant abnormality identified. Electronically Signed   By: Maisie Fushomas  Register   On: 03/04/2016 09:09    Procedures Procedures (including critical care time)  Medications Ordered in ED Medications  aspirin chewable tablet 324 mg (not administered)     Initial Impression / Assessment and Plan / ED Course  I have reviewed the triage vital signs and the nursing notes.  Pertinent labs & imaging results that were available during my care of the patient were reviewed by me and considered in my medical decision making (see chart for details).  Clinical Course   Patient is a healthy 20 year old male presenting today with a concerning story of chest pain and near-syncope. Patient's EKG is abnormal. Initially EKG patient was tachycardic  at 1:15 with Q waves present inferiorly with T-wave inversion. Second EKG done 20 minutes later showed improvement. Tachycardia has resolved but patient has persistent Q waves in inferior leads with minimal T-wave inversion in 3 and aVF. Patient currently is having mild chest pressure but denies any sharp pain. He has no risk factors for PE and no family history of clotting problems. Concern for potential dissection, pneumothorax, ACS.  Chest x-ray, CBC, BMP, troponin, d-dimer pending  10:54 AM Patient's chest x-ray with mild right middle lobe atelectasis but otherwise within normal limits. A leukocytosis of 13,000 but remainder of labs within normal limits.  CTA done of the chest to rule out dissection which was negative.  11:10 AM Patient remains tachycardic still with some chest discomfort. Given his abnormal EKGs will discuss with cardiology.  2:20 PM Patient evaluated by cardiology and they feel that this is not a cardiac issue.  Will treat with ibuprofen and inhaler when necessary. Patient encouraged to follow-up with PCP.  Final Clinical Impressions(s) / ED Diagnoses   Final diagnoses:  Atypical chest pain    New Prescriptions New Prescriptions   No medications on file     Gwyneth Sprout, MD 03/04/16 1422

## 2016-03-04 NOTE — ED Notes (Signed)
Cards was into see await dispo family aware

## 2016-03-04 NOTE — ED Notes (Signed)
Cards dr into see pt, it was PA before

## 2016-03-04 NOTE — ED Notes (Signed)
Back ffrom ct wants to sleep

## 2018-10-22 ENCOUNTER — Telehealth: Payer: Self-pay | Admitting: *Deleted

## 2018-10-22 NOTE — Telephone Encounter (Signed)
Copied from CRM [id number]. Topic: Appointment Scheduling - New Patient >> Oct 22, 2018 10:21 AM Elliot Gault wrote: Relation to pt: self  Call back number:  [phone]   Reason for call: Thomas Solomon [phone] patient of Dr. Drue Novel referring his nephew to establish care with Dr. Drue Novel. Patient is not experiencing any symptoms just seeking to have a physical done and in need of a PCP, please advise

## 2018-10-23 NOTE — Telephone Encounter (Signed)
Please advise 

## 2018-10-23 NOTE — Telephone Encounter (Signed)
Okay to schedule.  We could do a virtual physical exam follow-up by labs

## 2018-10-23 NOTE — Telephone Encounter (Signed)
Pt said he was expecting call back on yesterday and already found another doctor.

## 2018-10-23 NOTE — Telephone Encounter (Signed)
Okay to schedule NP appt virtually.

## 2019-12-19 ENCOUNTER — Ambulatory Visit (INDEPENDENT_AMBULATORY_CARE_PROVIDER_SITE_OTHER): Payer: 59

## 2019-12-19 ENCOUNTER — Ambulatory Visit (HOSPITAL_COMMUNITY)
Admission: EM | Admit: 2019-12-19 | Discharge: 2019-12-19 | Disposition: A | Discharge status: Home / Self Care | Payer: 59 | Attending: Physician Assistant | Admitting: Physician Assistant

## 2019-12-19 ENCOUNTER — Other Ambulatory Visit: Payer: Self-pay

## 2019-12-19 ENCOUNTER — Encounter (HOSPITAL_COMMUNITY): Payer: Self-pay

## 2019-12-19 DIAGNOSIS — S99912A Unspecified injury of left ankle, initial encounter: Secondary | ICD-10-CM | POA: Diagnosis not present

## 2019-12-19 DIAGNOSIS — S92352A Displaced fracture of fifth metatarsal bone, left foot, initial encounter for closed fracture: Secondary | ICD-10-CM

## 2019-12-19 DIAGNOSIS — S93412A Sprain of calcaneofibular ligament of left ankle, initial encounter: Secondary | ICD-10-CM

## 2019-12-19 DIAGNOSIS — S99922A Unspecified injury of left foot, initial encounter: Secondary | ICD-10-CM

## 2019-12-19 MED ORDER — HYDROCODONE-ACETAMINOPHEN 5-325 MG PO TABS: 1.0000 | ORAL_TABLET | Freq: Four times a day (QID) | ORAL | 0 refills | Status: AC | PRN

## 2019-12-19 NOTE — ED Triage Notes (Signed)
Pt presents with left foot & ankle pain from basketball injury on Friday.

## 2019-12-19 NOTE — Discharge Instructions (Addendum)
Wear boot and use crutches to ambulate. Remain non-weight bearing. Follow up with ortho. Take norco pain medication for severe pain, do not drive or drink alcohol on this medication.

## 2019-12-19 NOTE — ED Provider Notes (Signed)
[place]    CSN: [id number] Arrival date & time: 12/19/19  1304      History   Chief Complaint Chief Complaint  Patient presents with  . Ankle Pain  . Foot Pain    HPI Thomas Aldava is a 24 y.o. male.   Patient here concerned with L ankle pain x 2 days.  He was playing basketball and rolled ankle.  Admits pain, tenderness, swelling, ecchymosis, he states he was able to ambulate with some pain.  Has some numbness along lateral aspect of foot.     Past Medical History:  Diagnosis Date  . Childhood asthma     Patient Active Problem List   Diagnosis Date Noted  . Chest pain, unspecified 03/04/2016  . Leukocytosis 03/04/2016  . Hypokalemia 03/04/2016    History reviewed. No pertinent surgical history.     Home Medications    Prior to Admission medications   Medication Sig Start Date End Date Taking? Authorizing Provider  HYDROcodone-acetaminophen (NORCO/VICODIN) 5-325 MG tablet Take 1 tablet by mouth every 6 (six) hours as needed for severe pain. 12/19/19   Peri Jefferson, PA-C    Family History Family History  Problem Relation Age of Onset  . Diabetes Other     Social History Social History   Tobacco Use  . Smoking status: Never Smoker  . Smokeless tobacco: Never Used  Substance Use Topics  . Alcohol use: Yes    Comment: rare, 1x/month  . Drug use: Yes    Types: Marijuana     Allergies   Shellfish allergy   Review of Systems Review of Systems  Constitutional: Negative for activity change.  Gastrointestinal: Negative for nausea and vomiting.  Musculoskeletal: Positive for arthralgias, gait problem and joint swelling.  Skin: Positive for color change. Negative for pallor.  Neurological: Positive for numbness. Negative for weakness.  Hematological: Negative for adenopathy. Does not bruise/bleed easily.  Psychiatric/Behavioral: Negative for confusion and sleep disturbance.     Physical Exam Triage Vital Signs ED Triage Vitals   Enc Vitals Group     BP 12/19/19 1323 131/77     Pulse Rate 12/19/19 1323 88     Resp 12/19/19 1323 18     Temp 12/19/19 1323 97.7 F (36.5 C)     Temp Source 12/19/19 1323 Oral     SpO2 12/19/19 1323 98 %     Weight --      Height --      Head Circumference --      Peak Flow --      Pain Score 12/19/19 1324 10     Pain Loc --      Pain Edu? --      Excl. in [place]? --    No data found.  Updated Vital Signs BP 131/77 (BP Location: Left Arm)   Pulse 88   Temp 97.7 F (36.5 C) (Oral)   Resp 18   SpO2 98%   Visual Acuity Right Eye Distance:   Left Eye Distance:   Bilateral Distance:    Right Eye Near:   Left Eye Near:    Bilateral Near:     Physical Exam Vitals and nursing note reviewed.  Constitutional:      Appearance: Normal appearance. He is well-developed.  HENT:     Head: Normocephalic and atraumatic.     Nose: Nose normal.     Mouth/Throat:     Mouth: Mucous membranes are moist.  Eyes:     General: No  scleral icterus.    Conjunctiva/sclera: Conjunctivae normal.  Pulmonary:     Effort: Pulmonary effort is normal. No respiratory distress.  Musculoskeletal:     Cervical back: Normal range of motion and neck supple. No rigidity.     Left ankle: Swelling and ecchymosis present. No deformity. Tenderness present over the lateral malleolus and CF ligament. Decreased range of motion. Normal pulse.     Left Achilles Tendon: No tenderness. Thompson's test negative.     Left foot: Normal range of motion and normal capillary refill. Swelling and tenderness present. No deformity or laceration. Normal pulse.     Comments: ecchymosis along entire foot and lateral aspect of L ankle Tenderness along dorsum of foot as well as lateral malleolus  Skin:    General: Skin is warm and dry.     Capillary Refill: Capillary refill takes less than 2 seconds.  Neurological:     General: No focal deficit present.     Mental Status: He is alert and oriented to person, place, and time.      Motor: No weakness.     Gait: Gait abnormal.  Psychiatric:        Mood and Affect: Mood normal.        Behavior: Behavior normal.      UC Treatments / Results  Labs (all labs ordered are listed, but only abnormal results are displayed) Labs Reviewed - No data to display  EKG   Radiology DG Ankle Complete Left  Result Date: 12/19/2019 CLINICAL DATA:  Sports injury EXAM: LEFT FOOT - COMPLETE 3+ VIEW; LEFT ANKLE COMPLETE - 3+ VIEW COMPARISON:  None. FINDINGS: Comminuted, slightly displaced fractures of the base of the left fifth metatarsal, somewhat resorbed and nonacute appearing. No other fracture or dislocation of the left foot or ankle. Soft tissue edema overlying the lateral malleolus. IMPRESSION: 1. Comminuted, slightly displaced fractures of the base of the left fifth metatarsal, unusually somewhat resorbed and nonacute appearing. Correlate with referable clinical history. Consider CT to further evaluate fracture anatomy. 2.  No other fracture or dislocation of the left foot or ankle. 3.  Soft tissue edema overlying the lateral malleolus. Electronically Signed   By: Lauralyn Primes M.D.   On: 12/19/2019 14:08   DG Foot Complete Left  Result Date: 12/19/2019 CLINICAL DATA:  Sports injury EXAM: LEFT FOOT - COMPLETE 3+ VIEW; LEFT ANKLE COMPLETE - 3+ VIEW COMPARISON:  None. FINDINGS: Comminuted, slightly displaced fractures of the base of the left fifth metatarsal, somewhat resorbed and nonacute appearing. No other fracture or dislocation of the left foot or ankle. Soft tissue edema overlying the lateral malleolus. IMPRESSION: 1. Comminuted, slightly displaced fractures of the base of the left fifth metatarsal, unusually somewhat resorbed and nonacute appearing. Correlate with referable clinical history. Consider CT to further evaluate fracture anatomy. 2.  No other fracture or dislocation of the left foot or ankle. 3.  Soft tissue edema overlying the lateral malleolus. Electronically  Signed   By: Lauralyn Primes M.D.   On: 12/19/2019 14:08    Procedures Procedures (including critical care time)  Medications Ordered in UC Medications - No data to display  Initial Impression / Assessment and Plan / UC Course  I have reviewed the triage vital signs and the nursing notes.  Pertinent labs & imaging results that were available during my care of the patient were reviewed by me and considered in my medical decision making (see chart for details).     Ankle xray negative for  fracture Foot xray noted displaced fracture 5th metatarsal, appears somewhat non-acute, however, given clinical presentation will treat as acute and have patient follow up with ortho. Remain non-weight bearing.  Final Clinical Impressions(s) / UC Diagnoses   Final diagnoses:  Sprain of calcaneofibular ligament of left ankle, initial encounter  Closed displaced fracture of fifth metatarsal bone of left foot, initial encounter     Discharge Instructions     Wear boot and use crutches to ambulate. Remain non-weight bearing. Follow up with ortho. Take norco pain medication for severe pain, do not drive or drink alcohol on this medication.    ED Prescriptions    Medication Sig Dispense Auth. Provider   HYDROcodone-acetaminophen (NORCO/VICODIN) 5-325 MG tablet Take 1 tablet by mouth every 6 (six) hours as needed for severe pain. 10 tablet Evern Core, PA-C     I have reviewed the PDMP during this encounter.   Evern Core, PA-C 12/19/19 1416

## 2021-04-02 ENCOUNTER — Other Ambulatory Visit: Payer: Self-pay

## 2021-04-02 ENCOUNTER — Emergency Department (HOSPITAL_COMMUNITY)
Admission: EM | Admit: 2021-04-02 | Discharge: 2021-04-02 | Disposition: A | Discharge status: Home / Self Care | Payer: 59 | Attending: Emergency Medicine | Admitting: Emergency Medicine

## 2021-04-02 DIAGNOSIS — H9191 Unspecified hearing loss, right ear: Secondary | ICD-10-CM | POA: Diagnosis present

## 2021-04-02 DIAGNOSIS — J45909 Unspecified asthma, uncomplicated: Secondary | ICD-10-CM | POA: Diagnosis not present

## 2021-04-02 NOTE — ED Triage Notes (Signed)
Pt reports feeling like something is in his right ear x several years. Now he reports it is keeping him from hearing out of this ear and causing headaches and it is "hard for his ear to make earwax."

## 2021-04-02 NOTE — ED Provider Notes (Signed)
[facility] EMERGENCY DEPARTMENT Provider Note   CSN: [id number] Arrival date & time: 04/02/21  1737     History Chief Complaint  Patient presents with   Ear Problem    Thomas Solomon is a 25 y.o. male.  HPI  25 year old male presents the emergency department today for evaluation of discomfort to the right ear.  He states that for the last year at least he has had multiple symptoms in his right ear.  He states that he has had hearing loss persistently for at least the last week.  He also notes that his ear feels full and that he has a pressure behind it intermittently.  He notices that when he swims it feels like he gets water stuck behind the ear for long periods of time, more so than the left ear  Past Medical History:  Diagnosis Date   Childhood asthma     Patient Active Problem List   Diagnosis Date Noted   Chest pain, unspecified 03/04/2016   Leukocytosis 03/04/2016   Hypokalemia 03/04/2016    No past surgical history on file.     Family History  Problem Relation Age of Onset   Diabetes Other     Social History   Tobacco Use   Smoking status: Never   Smokeless tobacco: Never  Substance Use Topics   Alcohol use: Yes    Comment: rare, 1x/month   Drug use: Yes    Types: Marijuana    Home Medications Prior to Admission medications   Medication Sig Start Date End Date Taking? Authorizing Provider  HYDROcodone-acetaminophen (NORCO/VICODIN) 5-325 MG tablet Take 1 tablet by mouth every 6 (six) hours as needed for severe pain. 12/19/19   Evern Core, PA-C    Allergies    Amoxicillin and Shellfish allergy  Review of Systems   Review of Systems  Constitutional:  Negative for fever.  HENT:  Positive for ear pain. Negative for ear discharge.    Physical Exam Updated Vital Signs BP 140/82   Pulse 74   Temp 98.6 F (37 C) (Oral)   Resp 16   SpO2 99%   Physical Exam Constitutional:      General: He is not in acute distress.     Appearance: He is well-developed.  HENT:     Right Ear: Tympanic membrane normal.     Left Ear: Tympanic membrane normal.  Eyes:     Conjunctiva/sclera: Conjunctivae normal.  Cardiovascular:     Rate and Rhythm: Normal rate.  Pulmonary:     Effort: Pulmonary effort is normal.  Skin:    General: Skin is warm and dry.  Neurological:     Mental Status: He is alert and oriented to person, place, and time.    ED Results / Procedures / Treatments   Labs (all labs ordered are listed, but only abnormal results are displayed) Labs Reviewed - No data to display  EKG None  Radiology No results found.  Procedures Procedures   Medications Ordered in ED Medications - No data to display  ED Course  I have reviewed the triage vital signs and the nursing notes.  Pertinent labs & imaging results that were available during my care of the patient were reviewed by me and considered in my medical decision making (see chart for details).    MDM Rules/Calculators/A&P                          25 year old male presents  the emergency department today for evaluation of discomfort to the right ear ongoing for about a year.  Also with hearing loss that sounds more chronic in nature.  TMs are normal bilaterally and there is no evidence of infection to the external auditory canal.  No cerumen impaction.  Symptoms may be due to eustachian tube dysfunction, Flonase Rx given.  Given his hearing loss we will also give him follow-up with an ear nose and throat doctor.  He agrees to follow-up as directed.  Advised on return precautions.  Patient voiced understanding and is in agreement the plan.  All questions answered.  Patient stable for discharge   Final Clinical Impression(s) / ED Diagnoses Final diagnoses:  Hearing loss of right ear, unspecified hearing loss type    Rx / DC Orders ED Discharge Orders     None        Rayne Du 04/02/21 1755    Linwood Dibbles, MD 04/02/21  2149

## 2021-04-02 NOTE — Discharge Instructions (Addendum)
Use flonase as directed   Follow up with ear nose and throat about your hearing loss  Please return to the emergency department for any new or worsening symptoms.

## 2021-04-10 ENCOUNTER — Other Ambulatory Visit: Payer: Self-pay

## 2021-04-10 ENCOUNTER — Ambulatory Visit (INDEPENDENT_AMBULATORY_CARE_PROVIDER_SITE_OTHER): Payer: 59 | Admitting: Otolaryngology

## 2021-04-10 DIAGNOSIS — J310 Chronic rhinitis: Secondary | ICD-10-CM

## 2021-04-10 DIAGNOSIS — H6981 Other specified disorders of Eustachian tube, right ear: Secondary | ICD-10-CM | POA: Diagnosis not present

## 2021-04-10 NOTE — Progress Notes (Signed)
HPI: Thomas Solomon is a 25 y.o. male who presents is referred by ED for evaluation of ear complaints.  He felt like he had something in his right ear.  Describes that the ear sometimes gets congested.  He has had no drainage from the ear.  He feels like water occasionally gets trapped in the right ear..  Past Medical History:  Diagnosis Date   Childhood asthma    No past surgical history on file. Social History   Socioeconomic History   Marital status: Single    Spouse name: Not on file   Number of children: Not on file   Years of education: Not on file   Highest education level: Not on file  Occupational History   Not on file  Tobacco Use   Smoking status: Never   Smokeless tobacco: Never  Substance and Sexual Activity   Alcohol use: Yes    Comment: rare, 1x/month   Drug use: Yes    Types: Marijuana   Sexual activity: Not on file  Other Topics Concern   Not on file  Social History Narrative   Not on file   Social Determinants of Health   Financial Resource Strain: Not on file  Food Insecurity: Not on file  Transportation Needs: Not on file  Physical Activity: Not on file  Stress: Not on file  Social Connections: Not on file   Family History  Problem Relation Age of Onset   Diabetes Other    Allergies  Allergen Reactions   Amoxicillin Rash   Shellfish Allergy Swelling    Lip swelling, throat itchy, chest pressure   Prior to Admission medications   Medication Sig Start Date End Date Taking? Authorizing Provider  HYDROcodone-acetaminophen (NORCO/VICODIN) 5-325 MG tablet Take 1 tablet by mouth every 6 (six) hours as needed for severe pain. 12/19/19   Evern Core, PA-C     Positive ROS: Otherwise negative  All other systems have been reviewed and were otherwise negative with the exception of those mentioned in the HPI and as above.  Physical Exam: Constitutional: Alert, well-appearing, no acute distress Ears: External ears without lesions or tenderness.  Ear canal on the right side with minimal wax buildup that was cleaned with a curette.  The ear canal and TM are otherwise clear with no wax buildup deep within the ear canal.  The TM was clear with no middle ear effusion noted.  On tuning fork testing with a 512 fork lateralized to the left ear.  Subjectively with a 1024 tuning fork he heard slightly better in the left compared to the right but minimal difference. Nasal: External nose without lesions. Septum with minimal deformity and mild rhinitis.  After decongesting the nose both millimeters regions were clear..  Oral: Lips and gums without lesions. Tongue and palate mucosa without lesions. Posterior oropharynx clear. Neck: No palpable adenopathy or masses Respiratory: Breathing comfortably  Skin: No facial/neck lesions or rash noted.  Procedures  Assessment: Chronic rhinitis with eustachian tube dysfunction. No foreign body noted in the ear.  Plan: Discussed with patient concerning obtaining audiologic testing to evaluate hearing.  He is not interested in having hearing test at this time. He may have some mild eustachian tube dysfunction and recommended use of Flonase 2 sprays each nostril at night as this may help some with congestion in the ear. Reassured him of normal ear canal and TM examination otherwise.   Narda Bonds, MD   CC:

## 2022-01-13 IMAGING — DX DG ANKLE COMPLETE 3+V*L*
3 series · 3 of 3 positions shown · non-contrast
Comparison: None.

CLINICAL DATA: Sports injury

EXAM:
LEFT FOOT - COMPLETE 3+ VIEW; LEFT ANKLE COMPLETE - 3+ VIEW

[ankle ap]
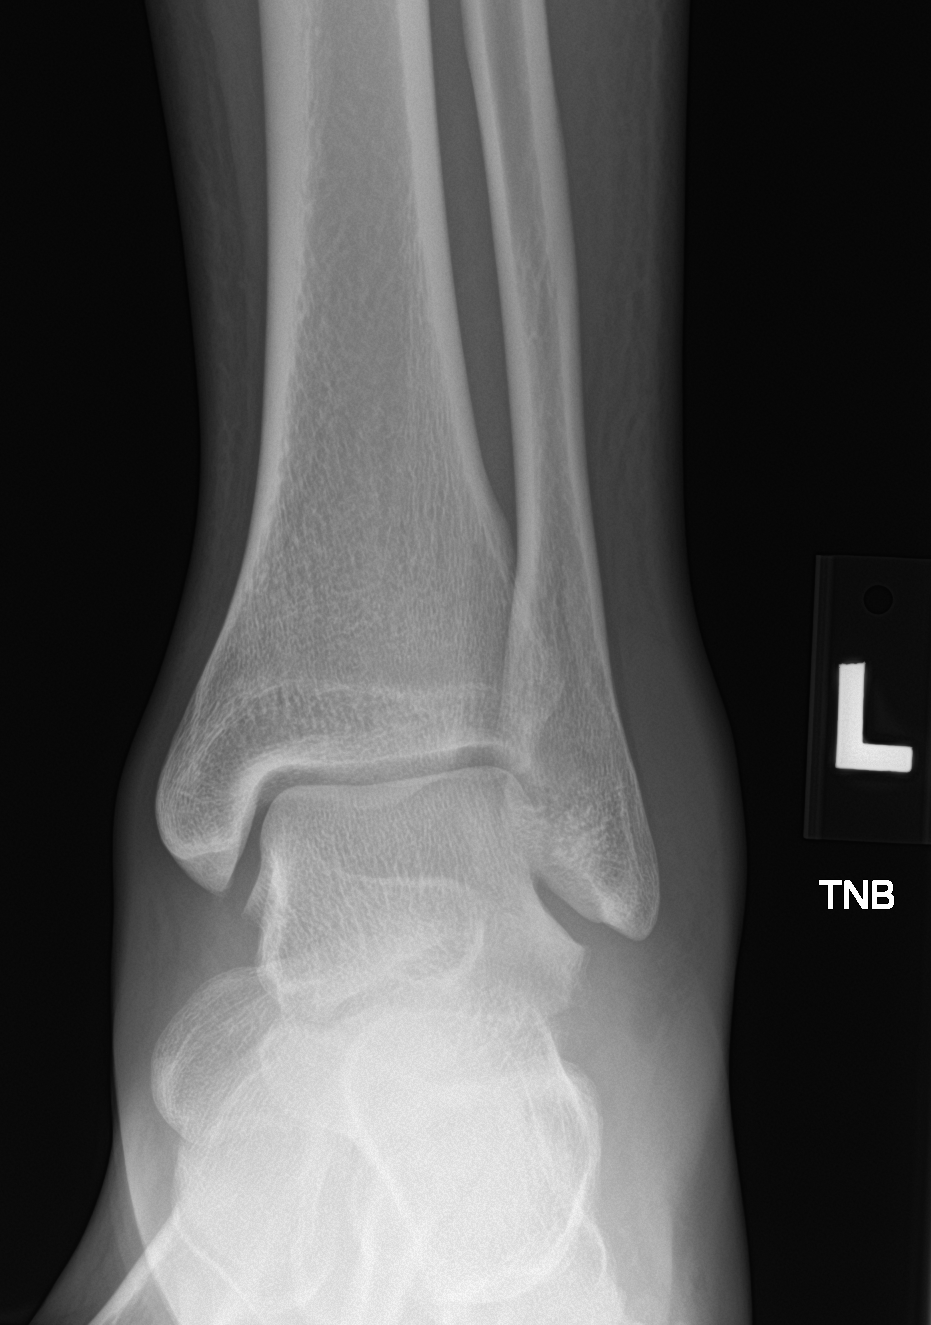

[ankle obl]
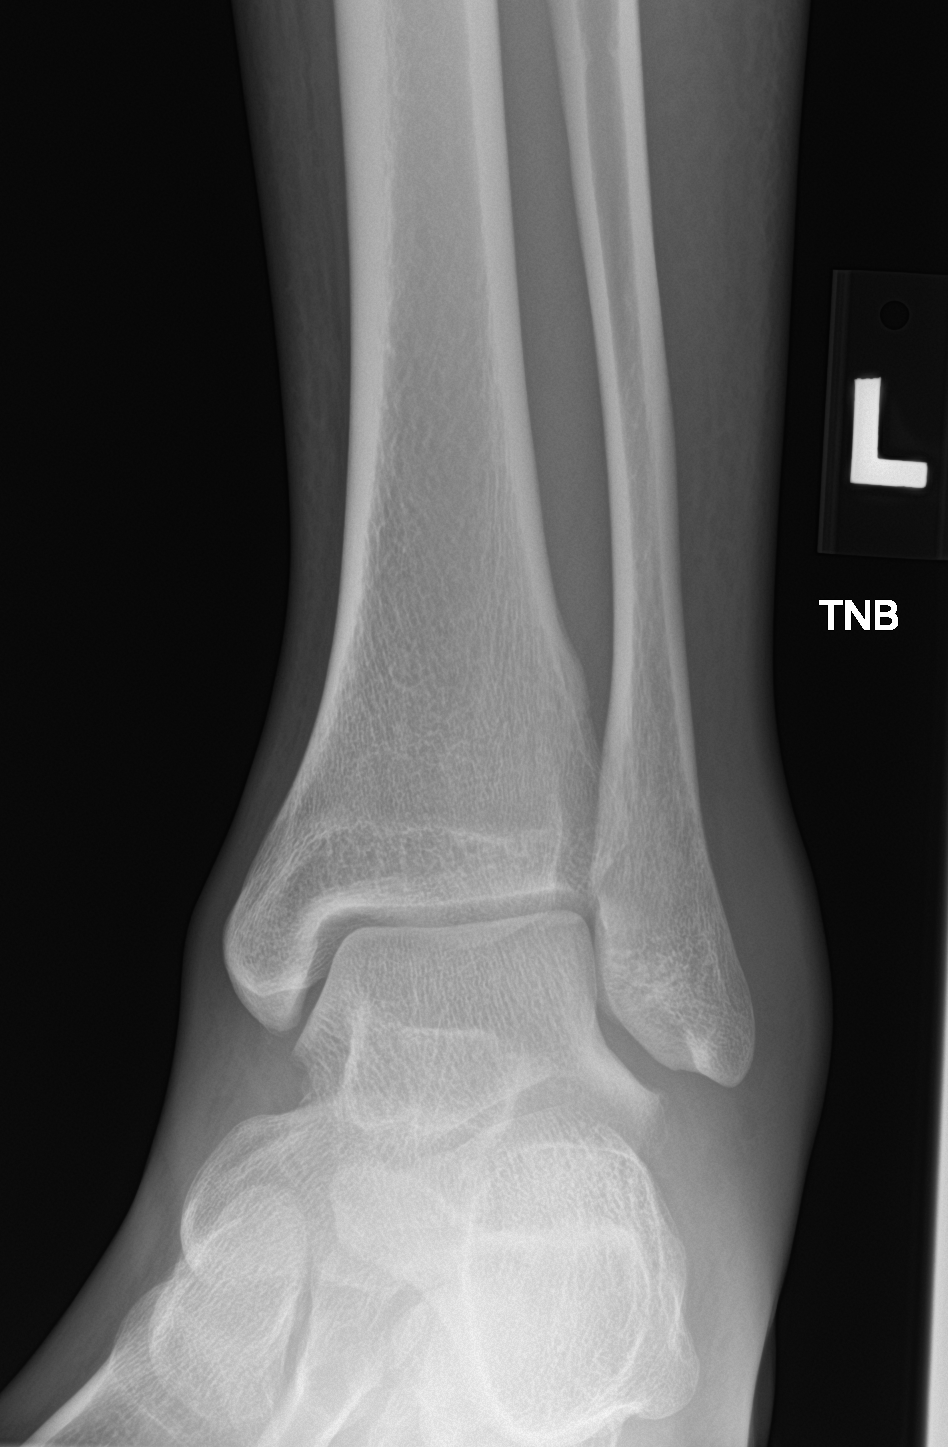

[ankle lat]
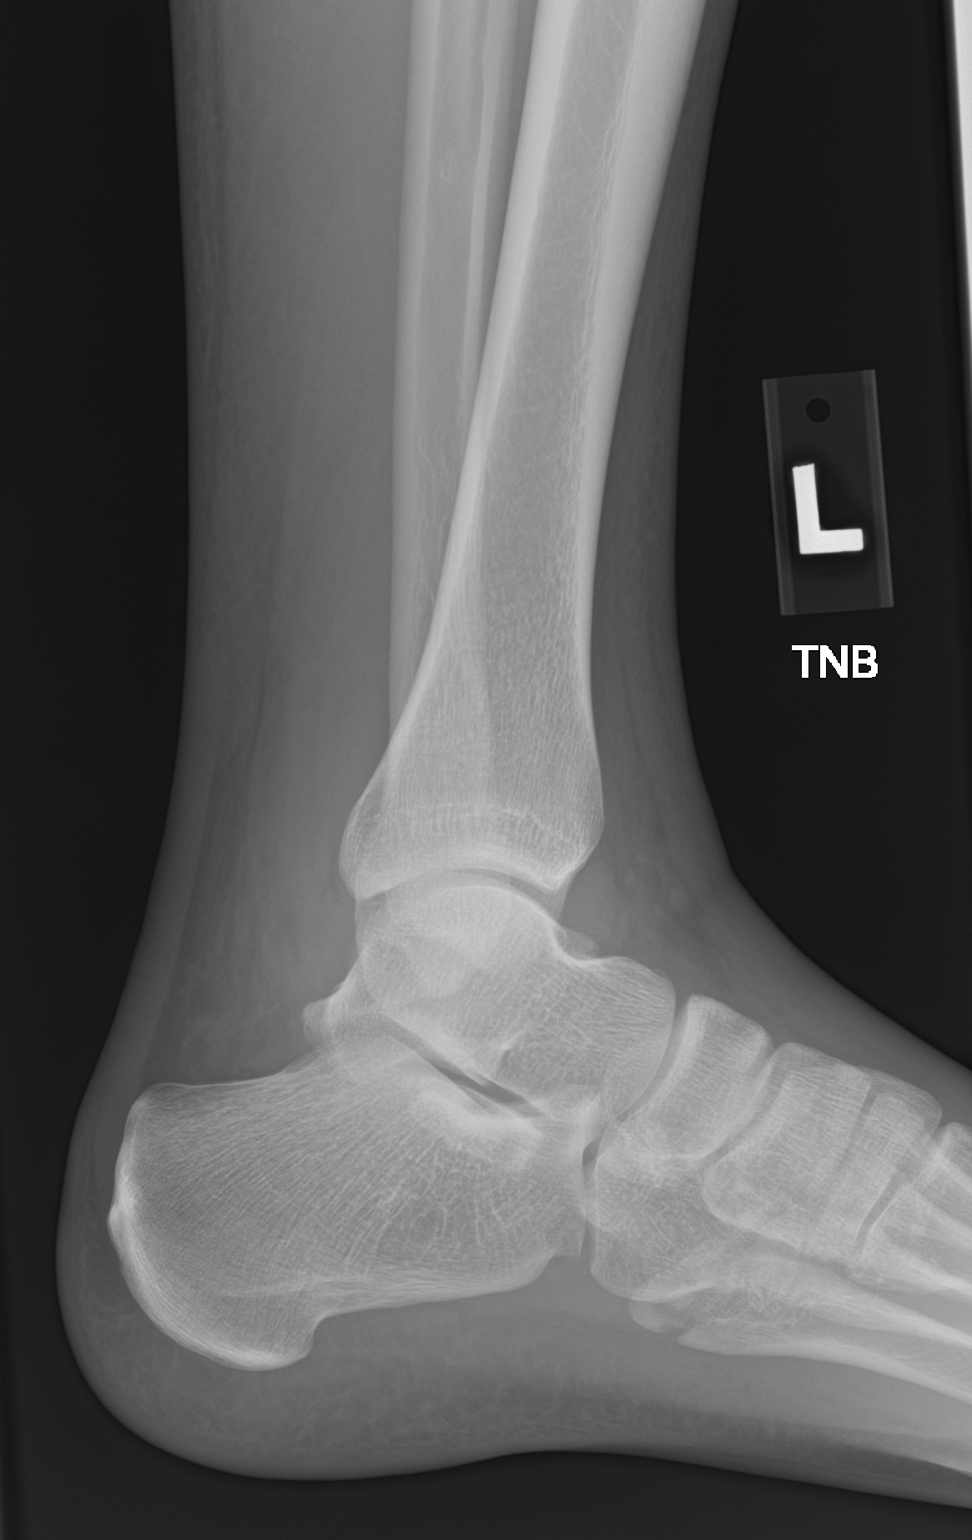

[3 of 3 positions shown; findings below may reference images not displayed]

FINDINGS: Comminuted, slightly displaced fractures of the base of the left
fifth metatarsal, somewhat resorbed and nonacute appearing. No other
fracture or dislocation of the left foot or ankle. Soft tissue edema
overlying the lateral malleolus.
IMPRESSION: 1. Comminuted, slightly displaced fractures of the base of the left
fifth metatarsal, unusually somewhat resorbed and nonacute
appearing. Correlate with referable clinical history. Consider CT to
further evaluate fracture anatomy.

2.  No other fracture or dislocation of the left foot or ankle.

3.  Soft tissue edema overlying the lateral malleolus.
